# Patient Record
Sex: Male | Born: 1965 | Race: Black or African American | Hispanic: No | Marital: Single | Smoking: Never smoker
Health system: Southern US, Community
[De-identification: ages and names within clinical notes are randomized; demographics above are authoritative.]

## PROBLEM LIST (undated history)

## (undated) HISTORY — PX: CHEST TUBE INSERTION: SHX231

---

## 2000-02-17 ENCOUNTER — Encounter: Payer: Self-pay | Admitting: Internal Medicine

## 2000-02-17 ENCOUNTER — Ambulatory Visit (HOSPITAL_COMMUNITY): Admission: RE | Admit: 2000-02-17 | Discharge: 2000-02-17 | Payer: Self-pay | Admitting: Internal Medicine

## 2002-12-27 ENCOUNTER — Emergency Department (HOSPITAL_COMMUNITY): Admission: EM | Admit: 2002-12-27 | Discharge: 2002-12-27 | Payer: Self-pay | Admitting: Emergency Medicine

## 2006-09-27 ENCOUNTER — Ambulatory Visit: Payer: Self-pay | Admitting: Family Medicine

## 2007-02-07 ENCOUNTER — Encounter (INDEPENDENT_AMBULATORY_CARE_PROVIDER_SITE_OTHER): Payer: Self-pay | Admitting: Surgery

## 2007-02-07 ENCOUNTER — Ambulatory Visit (HOSPITAL_BASED_OUTPATIENT_CLINIC_OR_DEPARTMENT_OTHER): Admission: RE | Admit: 2007-02-07 | Discharge: 2007-02-07 | Payer: Self-pay | Admitting: Surgery

## 2007-08-31 ENCOUNTER — Emergency Department (HOSPITAL_COMMUNITY): Admission: EM | Admit: 2007-08-31 | Discharge: 2007-08-31 | Payer: Self-pay | Admitting: Emergency Medicine

## 2007-09-01 ENCOUNTER — Ambulatory Visit (HOSPITAL_COMMUNITY): Admission: RE | Admit: 2007-09-01 | Discharge: 2007-09-01 | Payer: Self-pay | Admitting: Otolaryngology

## 2007-09-01 ENCOUNTER — Encounter (INDEPENDENT_AMBULATORY_CARE_PROVIDER_SITE_OTHER): Payer: Self-pay | Admitting: Otolaryngology

## 2007-09-27 ENCOUNTER — Ambulatory Visit (HOSPITAL_COMMUNITY): Admission: RE | Admit: 2007-09-27 | Discharge: 2007-09-27 | Payer: Self-pay | Admitting: Otolaryngology

## 2007-10-11 ENCOUNTER — Ambulatory Visit (HOSPITAL_BASED_OUTPATIENT_CLINIC_OR_DEPARTMENT_OTHER): Admission: RE | Admit: 2007-10-11 | Discharge: 2007-10-11 | Payer: Self-pay | Admitting: Otolaryngology

## 2009-08-13 ENCOUNTER — Ambulatory Visit: Payer: Self-pay | Admitting: Family Medicine

## 2009-10-29 ENCOUNTER — Ambulatory Visit: Payer: Self-pay | Admitting: Family Medicine

## 2009-10-29 LAB — CONVERTED CEMR LAB
ALT: 31 units/L (ref 0–53)
AST: 24 units/L (ref 0–37)
Albumin: 4.7 g/dL (ref 3.5–5.2)
Alkaline Phosphatase: 43 units/L (ref 39–117)
BUN: 17 mg/dL (ref 6–23)
Basophils Absolute: 0.1 10*3/uL (ref 0.0–0.1)
Basophils Relative: 1 % (ref 0–1)
CO2: 25 meq/L (ref 19–32)
Calcium: 9.5 mg/dL (ref 8.4–10.5)
Chloride: 103 meq/L (ref 96–112)
Cholesterol: 273 mg/dL — ABNORMAL HIGH (ref 0–200)
Creatinine, Ser: 1.1 mg/dL (ref 0.40–1.50)
Eosinophils Absolute: 0.2 10*3/uL (ref 0.0–0.7)
Eosinophils Relative: 4 % (ref 0–5)
Glucose, Bld: 90 mg/dL (ref 70–99)
HCT: 45.2 % (ref 39.0–52.0)
HDL: 64 mg/dL (ref >39)
Hemoglobin: 14.9 g/dL (ref 13.0–17.0)
LDL Cholesterol: 185 mg/dL — ABNORMAL HIGH (ref 0–99)
Lymphocytes Relative: 32 % (ref 12–46)
Lymphs Abs: 1.7 10*3/uL (ref 0.7–4.0)
MCHC: 33 g/dL (ref 30.0–36.0)
MCV: 89.2 fL (ref 78.0–100.0)
Monocytes Absolute: 0.4 10*3/uL (ref 0.1–1.0)
Monocytes Relative: 7 % (ref 3–12)
Neutro Abs: 3 10*3/uL (ref 1.7–7.7)
Neutrophils Relative %: 56 % (ref 43–77)
Platelets: 255 10*3/uL (ref 150–400)
Potassium: 4.5 meq/L (ref 3.5–5.3)
RBC: 5.07 M/uL (ref 4.22–5.81)
RDW: 14.4 % (ref 11.5–15.5)
Sodium: 140 meq/L (ref 135–145)
Total Bilirubin: 1.3 mg/dL — ABNORMAL HIGH (ref 0.3–1.2)
Total CHOL/HDL Ratio: 4.3
Total Protein: 7.6 g/dL (ref 6.0–8.3)
Triglycerides: 122 mg/dL (ref <150)
VLDL: 24 mg/dL (ref 0–40)
WBC: 5.4 10*3/uL (ref 4.0–10.5)

## 2009-12-03 ENCOUNTER — Ambulatory Visit: Payer: Self-pay | Admitting: Family Medicine

## 2009-12-03 LAB — CONVERTED CEMR LAB
Chlamydia, Swab/Urine, PCR: NEGATIVE
GC Probe Amp, Urine: NEGATIVE

## 2009-12-17 ENCOUNTER — Ambulatory Visit: Payer: Self-pay | Admitting: Family Medicine

## 2010-11-02 NOTE — Op Note (Signed)
NAMEHARVARD, ZEISS               ACCOUNT NO.:  [id number]   MEDICAL RECORD NO.:  [id number]          PATIENT TYPE:  AMB   LOCATION:  DSC                          FACILITY:  [facility]   PHYSICIAN:  Suzanna Obey, M.D.       DATE OF BIRTH:  08-Jul-1965   DATE OF PROCEDURE:  10/11/2007  DATE OF DISCHARGE:                               OPERATIVE REPORT   PREOPERATIVE DIAGNOSIS:  Mandibular fracture.   POSTOPERATIVE DIAGNOSIS:  Mandibular fracture.   SURGICAL PROCEDURE:  Removal of wires and screws.   SURGEON:  Suzanna Obey, MD   ANESTHESIA:  Local.   ESTIMATED BLOOD LOSS:  Less than 5 mL.   INDICATION:  A 45 year old who broke his mandible about 6 weeks ago, has  had MMF in place, and now is ready to have this removed.  He was  informed the risks and benefits as well as the options.  All questions  were answered and consent was obtained.   OPERATION:  The patient was taken to the operating room and placed in  supine position after local injection and some sedation.  The wires were  cut, releasing his mandible.  The wires were then removed from the  screws.  The screws were then dissected, as they were covered with  mucosa, and they were removed with a screw driver.  He tolerated this  very well.  There was good hemostasis.  He was awakened and brought to  recovery room in stable condition and counts were correct.           ______________________________  Suzanna Obey, M.D.     JB/MEDQ  D:  10/11/2007  T:  10/12/2007  Job:  [id number]
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
## 2011-03-14 LAB — CBC
HCT: 43.3
MCHC: 34
MCV: 87.3
Platelets: 255
WBC: 5.5

## 2011-11-16 ENCOUNTER — Emergency Department (HOSPITAL_COMMUNITY)
Admission: EM | Admit: 2011-11-16 | Discharge: 2011-11-17 | Disposition: A | Discharge status: Home / Self Care | Payer: Self-pay | Attending: Emergency Medicine | Admitting: Emergency Medicine

## 2011-11-16 ENCOUNTER — Emergency Department (HOSPITAL_COMMUNITY): Payer: Self-pay

## 2011-11-16 ENCOUNTER — Encounter (HOSPITAL_COMMUNITY): Payer: Self-pay | Admitting: Emergency Medicine

## 2011-11-16 DIAGNOSIS — M542 Cervicalgia: Secondary | ICD-10-CM | POA: Insufficient documentation
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
## 2011-11-17 MED ORDER — HYDROCODONE-ACETAMINOPHEN 5-325 MG PO TABS: 2.0000 | ORAL_TABLET | Freq: Four times a day (QID) | ORAL | Status: AC | PRN

## 2011-11-17 MED ORDER — CYCLOBENZAPRINE HCL 5 MG PO TABS: 5.0000 mg | ORAL_TABLET | Freq: Three times a day (TID) | ORAL | Status: AC | PRN

## 2011-11-17 MED ORDER — CYCLOBENZAPRINE HCL 10 MG PO TABS: 5.0000 mg | ORAL_TABLET | Freq: Once | ORAL | Status: DC

## 2011-11-17 MED ORDER — KETOROLAC TROMETHAMINE 30 MG/ML IJ SOLN
30.0000 mg | Freq: Once | INTRAMUSCULAR | Status: AC
  Administered 2011-11-17: 30 mg via INTRAMUSCULAR
  Filled 2011-11-17: qty 1

## 2011-11-17 NOTE — ED Provider Notes (Signed)
Medical screening examination/treatment/procedure(s) were performed by non-physician practitioner and as supervising physician I was immediately available for consultation/collaboration.   Lyanne Co, MD 11/17/11 [phone]
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
## 2014-07-26 ENCOUNTER — Encounter (HOSPITAL_BASED_OUTPATIENT_CLINIC_OR_DEPARTMENT_OTHER): Payer: Self-pay | Admitting: *Deleted

## 2014-07-26 ENCOUNTER — Emergency Department (HOSPITAL_BASED_OUTPATIENT_CLINIC_OR_DEPARTMENT_OTHER)
Admission: EM | Admit: 2014-07-26 | Discharge: 2014-07-26 | Disposition: A | Discharge status: Home / Self Care | Payer: No Typology Code available for payment source | Attending: Emergency Medicine | Admitting: Emergency Medicine

## 2014-07-26 DIAGNOSIS — S199XXA Unspecified injury of neck, initial encounter: Secondary | ICD-10-CM | POA: Insufficient documentation

## 2014-07-26 DIAGNOSIS — Z791 Long term (current) use of non-steroidal anti-inflammatories (NSAID): Secondary | ICD-10-CM | POA: Insufficient documentation

## 2014-07-26 DIAGNOSIS — Y998 Other external cause status: Secondary | ICD-10-CM | POA: Diagnosis not present

## 2014-07-26 DIAGNOSIS — M25552 Pain in left hip: Secondary | ICD-10-CM

## 2014-07-26 DIAGNOSIS — S4992XA Unspecified injury of left shoulder and upper arm, initial encounter: Secondary | ICD-10-CM | POA: Diagnosis present

## 2014-07-26 DIAGNOSIS — R51 Headache: Secondary | ICD-10-CM | POA: Diagnosis not present

## 2014-07-26 DIAGNOSIS — S79912A Unspecified injury of left hip, initial encounter: Secondary | ICD-10-CM | POA: Insufficient documentation

## 2014-07-26 DIAGNOSIS — S46819A Strain of other muscles, fascia and tendons at shoulder and upper arm level, unspecified arm, initial encounter: Secondary | ICD-10-CM

## 2014-07-26 DIAGNOSIS — Y9389 Activity, other specified: Secondary | ICD-10-CM | POA: Diagnosis not present

## 2014-07-26 DIAGNOSIS — Y92410 Unspecified street and highway as the place of occurrence of the external cause: Secondary | ICD-10-CM | POA: Insufficient documentation

## 2014-07-26 DIAGNOSIS — S46912A Strain of unspecified muscle, fascia and tendon at shoulder and upper arm level, left arm, initial encounter: Secondary | ICD-10-CM | POA: Diagnosis not present

## 2014-07-26 NOTE — Discharge Instructions (Signed)
If you were given medicines take as directed.  If you are on coumadin or contraceptives realize their levels and effectiveness is altered by many different medicines.  If you have any reaction (rash, tongues swelling, other) to the medicines stop taking and see a physician.   Please follow up as directed and return to the ER or see a physician for new or worsening symptoms.  Thank you. Filed Vitals:   07/26/14 1652 07/26/14 1942  BP: 128/75 128/83  Pulse: 86 60  Temp: 98.3 F (36.8 C) 97.5 F (36.4 C)  TempSrc: Oral Oral  Resp: 18 18  Height: 5\' 7"  (1.702 m)   Weight: 170 lb (77.111 kg)   SpO2: 99% 100%
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
## 2015-12-07 ENCOUNTER — Encounter (HOSPITAL_COMMUNITY): Payer: Self-pay | Admitting: *Deleted

## 2015-12-07 ENCOUNTER — Emergency Department (HOSPITAL_COMMUNITY)
Admission: EM | Admit: 2015-12-07 | Discharge: 2015-12-07 | Disposition: A | Discharge status: Home / Self Care | Payer: No Typology Code available for payment source | Attending: Emergency Medicine | Admitting: Emergency Medicine

## 2015-12-07 ENCOUNTER — Emergency Department (HOSPITAL_COMMUNITY): Payer: No Typology Code available for payment source

## 2015-12-07 DIAGNOSIS — S62631B Displaced fracture of distal phalanx of left index finger, initial encounter for open fracture: Secondary | ICD-10-CM | POA: Insufficient documentation

## 2015-12-07 DIAGNOSIS — S62639B Displaced fracture of distal phalanx of unspecified finger, initial encounter for open fracture: Secondary | ICD-10-CM

## 2015-12-07 DIAGNOSIS — Y92810 Car as the place of occurrence of the external cause: Secondary | ICD-10-CM | POA: Insufficient documentation

## 2015-12-07 DIAGNOSIS — Z79899 Other long term (current) drug therapy: Secondary | ICD-10-CM | POA: Insufficient documentation

## 2015-12-07 DIAGNOSIS — Y990 Civilian activity done for income or pay: Secondary | ICD-10-CM | POA: Insufficient documentation

## 2015-12-07 DIAGNOSIS — Y9389 Activity, other specified: Secondary | ICD-10-CM | POA: Insufficient documentation

## 2015-12-07 DIAGNOSIS — W230XXA Caught, crushed, jammed, or pinched between moving objects, initial encounter: Secondary | ICD-10-CM | POA: Insufficient documentation

## 2015-12-07 MED ORDER — HYDROCODONE-ACETAMINOPHEN 5-325 MG PO TABS: 2.0000 | ORAL_TABLET | ORAL | Status: AC | PRN

## 2015-12-07 MED ORDER — TETANUS-DIPHTH-ACELL PERTUSSIS 5-2.5-18.5 LF-MCG/0.5 IM SUSP
0.5000 mL | Freq: Once | INTRAMUSCULAR | Status: AC
  Administered 2015-12-07: 0.5 mL via INTRAMUSCULAR
  Filled 2015-12-07: qty 0.5

## 2015-12-07 MED ORDER — CEPHALEXIN 500 MG PO CAPS: 500.0000 mg | ORAL_CAPSULE | Freq: Four times a day (QID) | ORAL | Status: AC

## 2015-12-07 MED ORDER — HYDROCODONE-ACETAMINOPHEN 5-325 MG PO TABS
1.0000 | ORAL_TABLET | Freq: Once | ORAL | Status: AC
  Administered 2015-12-07: 1 via ORAL
  Filled 2015-12-07: qty 1

## 2015-12-07 NOTE — Discharge Instructions (Signed)
Finger Fracture Fractures of fingers are breaks in the bones of the fingers. There are many types of fractures. There are different ways of treating these fractures. Your health care provider will discuss the best way to treat your fracture. CAUSES Traumatic injury is the main cause of broken fingers. These include:  Injuries while playing sports.  Workplace injuries.  Falls. RISK FACTORS Activities that can increase your risk of finger fractures include:  Sports.  Workplace activities that involve machinery.  A condition called osteoporosis, which can make your bones less dense and cause them to fracture more easily. SIGNS AND SYMPTOMS The main symptoms of a broken finger are pain and swelling within 15 minutes after the injury. Other symptoms include:  Bruising of your finger.  Stiffness of your finger.  Numbness of your finger.  Exposed bones (compound fracture) if the fracture is severe. DIAGNOSIS  The best way to diagnose a broken bone is with X-ray imaging. Additionally, your health care provider will use this X-ray image to evaluate the position of the broken finger bones.  TREATMENT  Finger fractures can be treated with:   Nonreduction--This means the bones are in place. The finger is splinted without changing the positions of the bone pieces. The splint is usually left on for about a week to 10 days. This will depend on your fracture and what your health care provider thinks.  Closed reduction--The bones are put back into position without using surgery. The finger is then splinted.  Open reduction and internal fixation--The fracture site is opened. Then the bone pieces are fixed into place with pins or some type of hardware. This is seldom required. It depends on the severity of the fracture. HOME CARE INSTRUCTIONS   Follow your health care provider's instructions regarding activities, exercises, and physical therapy.  Only take over-the-counter or prescription  medicines for pain, discomfort, or fever as directed by your health care provider. SEEK MEDICAL CARE IF: You have pain or swelling that limits the motion or use of your fingers. SEEK IMMEDIATE MEDICAL CARE IF:  Your finger becomes numb. MAKE SURE YOU:   Understand these instructions.  Will watch your condition.  Will get help right away if you are not doing well or get worse.   This information is not intended to replace advice given to you by your health care provider. Make sure you discuss any questions you have with your health care provider.  You will be contacted tomorrow to have an appointment scheduled with Dr. Janee Mornhompson who is a hand orthopedic surgeon. If you do not hear from them feel free to make the phone call yourself. You may wash your finger with antibacterial soap and water. Take pain medication as needed. Take antibiotics as prescribed. Return to the ED if you experience severe worsening of your symptoms, fevers, chills, increased redness or swelling around your wound.

## 2015-12-07 NOTE — ED Provider Notes (Signed)
CSN: [id number]     Arrival date & time 12/07/15  1244 History  By signing my name below, I, Soijett Blue, attest that this documentation has been prepared under the direction and in the presence of Gaylyn Rong, PA-C Electronically Signed: Soijett Blue, ED Scribe. 12/07/2015. 2:08 PM.  No chief complaint on file.     The history is provided by the patient. No language interpreter was used.    William Yoder is a 50 y.o. male who presents to the Emergency Department complaining of left index finger injury occurring 4 hours PTA. Pt reports that he accidentally slammed his left index finger between a hitch and trailer while working today. Pt is unsure of the status of his last tetanus immunization. Pt is having associated symptoms of laceration to left index finger and numbness. He notes that he has tried wound care with hydrogen peroxide for the relief of his symptoms. He denies any other symptoms.    History reviewed. No pertinent past medical history. Past Surgical History  Procedure Laterality Date  . Chest tube insertion     Family History  Problem Relation Age of Onset  . Hypertension Father    Social History  Substance Use Topics  . Smoking status: Never Smoker   . Smokeless tobacco: None  . Alcohol Use: No    Review of Systems  Musculoskeletal: Negative for joint swelling and arthralgias.  Skin: Positive for wound (laceration to left index finger).  Neurological: Positive for numbness (left index finger).  All other systems reviewed and are negative.     Allergies  Review of patient's allergies indicates no known allergies.  Home Medications   Prior to Admission medications   Medication Sig Start Date End Date Taking? Authorizing Provider  ibuprofen (ADVIL,MOTRIN) 200 MG tablet Take 400 mg by mouth every 6 (six) hours as needed.    Historical Provider, MD  naproxen sodium (ANAPROX) 220 MG tablet Take 220 mg by mouth 2 (two) times daily with a meal.     Historical Provider, MD   BP 152/103 mmHg  Pulse 63  Temp(Src) 98 F (36.7 C) (Oral)  Resp 17  SpO2 99% Physical Exam  Constitutional: He is oriented to person, place, and time. He appears well-developed and well-nourished. No distress.  HENT:  Head: Normocephalic and atraumatic.  Eyes: Conjunctivae are normal. Right eye exhibits no discharge. Left eye exhibits no discharge. No scleral icterus.  Cardiovascular: Normal rate.   Pulmonary/Chest: Effort normal.  Musculoskeletal:  See picture below  Neurological: He is alert and oriented to person, place, and time. Coordination normal.  Skin: Skin is warm and dry. No rash noted. He is not diaphoretic. No erythema. No pallor.  Psychiatric: He has a normal mood and affect. His behavior is normal.  Nursing note and vitals reviewed.     ED Course  Procedures (including critical care time) DIAGNOSTIC STUDIES: Oxygen Saturation is 99% on RA, nl by my interpretation.    COORDINATION OF CARE: 2:07 PM Discussed treatment plan with pt at bedside which includes update tetanus, left index finger, consult hand specialist, and pt agreed to plan.  3:01 PM- Consult with hand specialist, Dr. Janee Morn who recommends abx Rx, and the office will call the pt tomorrow with follow up appointment.   Labs Review Labs Reviewed - No data to display  Imaging Review Dg Finger Index Left  12/07/2015  CLINICAL DATA:  50 year old male with a history of injury to the left index finger. Associated laceration EXAM: LEFT  INDEX FINGER 2+V COMPARISON:  None. FINDINGS: Comminuted fracture distal phalanx of the left first finger. Fracture fragments are minimally displaced. No radiopaque foreign body. Soft tissue swelling. IMPRESSION: Comminuted fracture of the distal phalanx left first finger with minimal displacement of the fracture fragments. Signed, Yvone NeuJaime S. Loreta AveWagner, DO [facility] Electronically Signed   By:  Gilmer MorJaime  Wagner D.O.   On: 12/07/2015 13:41   I have personally reviewed and evaluated these images as part of my medical decision-making.   EKG Interpretation None      MDM   Final diagnoses:  Phalanx, distal fracture of finger, open, initial encounter    Patient X-Ray positive for comminuted fracture of the distal phalanx left first finger with minimal displacement of the fracture fragments. There is overlying laceration therefore creating an open fracture. Spoke with Dr. Janee Mornhompson with hand surgery who recommends abx, splinting and he will have his office call pt tomorrow to schedule follow up appointment. Wound cleaned and dressed while in ED. Will update tetanus in ED. Will discharge home with keflex and Norco. Patient will be discharged home & is agreeable with above plan. Returns precautions discussed. Pt appears safe for discharge.  I personally performed the services described in this documentation, which was scribed in my presence. The recorded information has been reviewed and is accurate.     Lester KinsmanSamantha Tripp Lu VerneDowless, PA-C 12/07/15 1618  Gerhard Munchobert Lockwood, MD 12/07/15 1754

## 2015-12-07 NOTE — ED Notes (Signed)
Patient slammed the index finger of his left hand in a car door @ 2 hours PTA.  Patient now c/o laceration and pain.

## 2018-03-26 ENCOUNTER — Other Ambulatory Visit: Payer: Self-pay | Admitting: Urology

## 2018-03-27 LAB — PSA: PROSTATE SPECIFIC AG, SERUM: 0.5 ng/mL (ref 0.0–4.0)

## 2018-07-17 ENCOUNTER — Ambulatory Visit: Payer: Self-pay | Admitting: Internal Medicine

## 2020-07-22 ENCOUNTER — Other Ambulatory Visit: Payer: Self-pay | Admitting: Internal Medicine

## 2020-07-22 DIAGNOSIS — K2100 Gastro-esophageal reflux disease with esophagitis, without bleeding: Secondary | ICD-10-CM

## 2020-07-24 ENCOUNTER — Other Ambulatory Visit: Payer: Self-pay

## 2020-07-28 ENCOUNTER — Ambulatory Visit
Admission: RE | Admit: 2020-07-28 | Discharge: 2020-07-28 | Disposition: A | Discharge status: Home / Self Care | Payer: 59 | Source: Ambulatory Visit | Attending: Internal Medicine | Admitting: Internal Medicine

## 2020-07-28 DIAGNOSIS — K2100 Gastro-esophageal reflux disease with esophagitis, without bleeding: Secondary | ICD-10-CM

## 2020-08-10 ENCOUNTER — Inpatient Hospital Stay: Admission: RE | Admit: 2020-08-10 | Payer: 59 | Source: Ambulatory Visit
